# Patient Record
Sex: Female | Born: 1990 | Race: White | Hispanic: No | Marital: Single | Smoking: Never smoker
Health system: Southern US, Community
[De-identification: ages and names within clinical notes are randomized; demographics above are authoritative.]

---
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
## 2008-05-29 ENCOUNTER — Emergency Department (HOSPITAL_COMMUNITY): Admission: EM | Admit: 2008-05-29 | Discharge: 2008-05-30 | Payer: Self-pay | Admitting: Emergency Medicine

## 2009-12-12 IMAGING — US US ABDOMEN COMPLETE
1 series · 14 of 25 positions shown · non-contrast
Comparison: None

CLINICAL DATA: Abdominal pain, vomiting

ABDOMEN ULTRASOUND
TECHNIQUE: Complete abdominal ultrasound examination was performed
including evaluation of the liver, gallbladder, bile ducts,
pancreas, kidneys, spleen, IVC, and abdominal aorta.

[Series 1: unknown · 0.33mm/px · 14 of 46 slices shown]
[im 1/46]
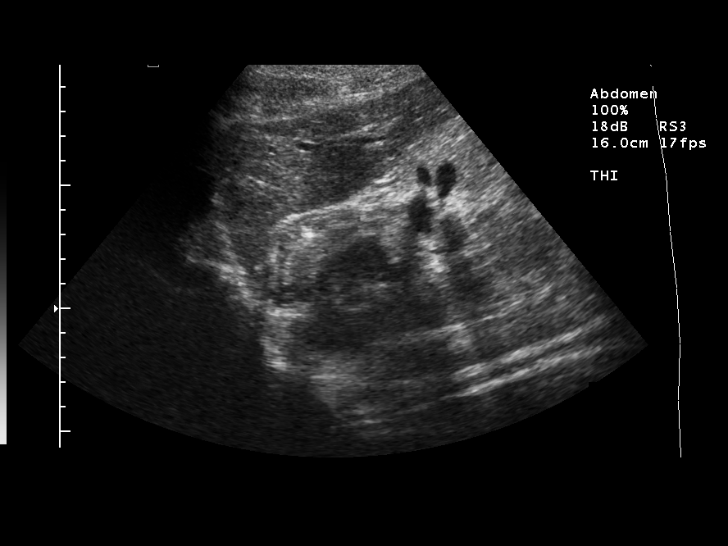
[im 4/46]
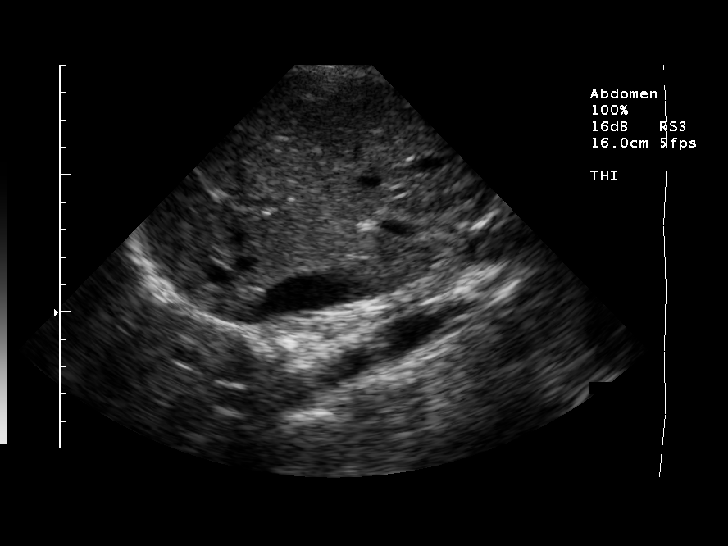
[im 8/46]
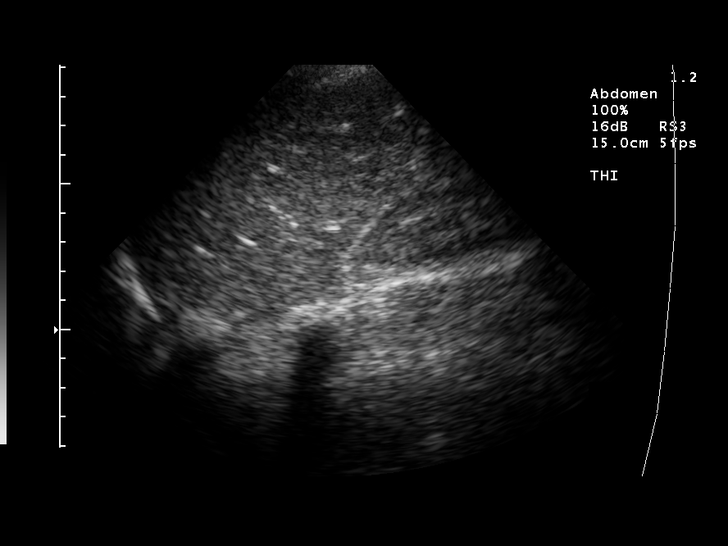
[im 12/46]
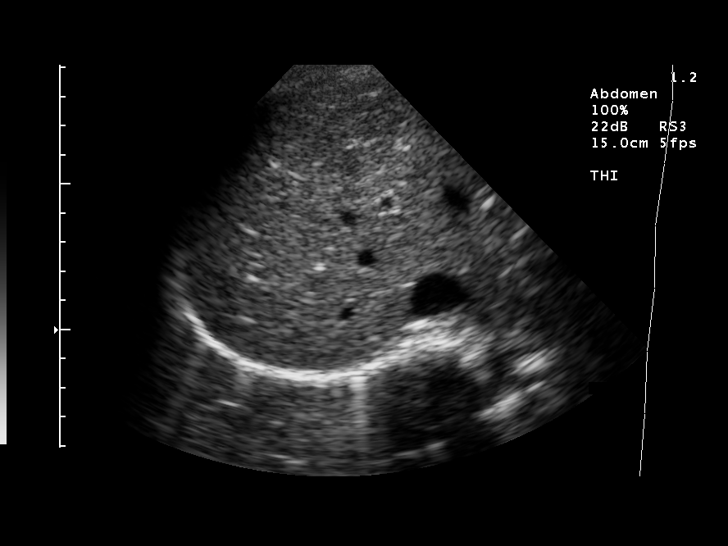
[im 16/46]
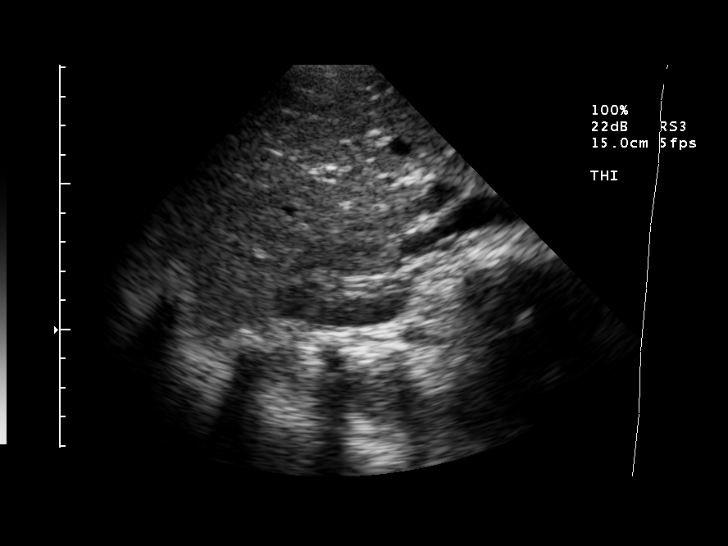
[im 17/46]
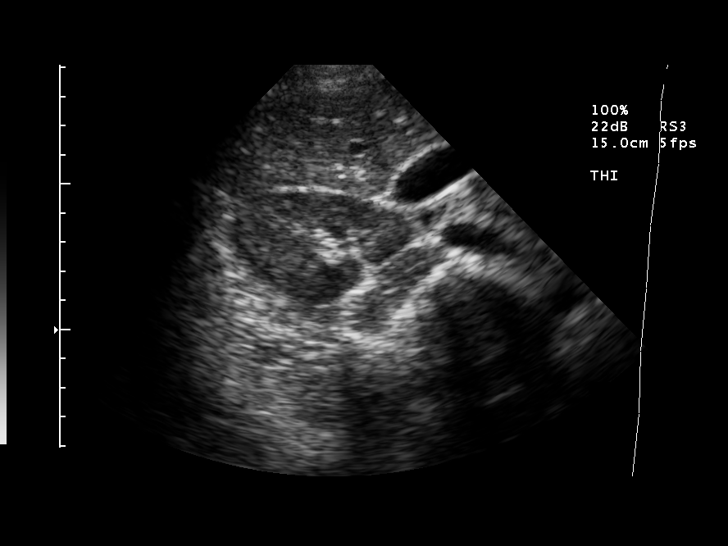
[im 21/46]
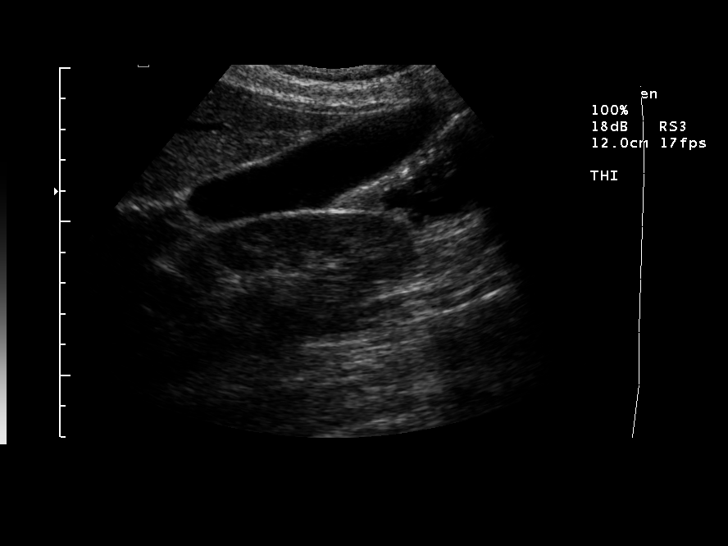
[im 25/46]
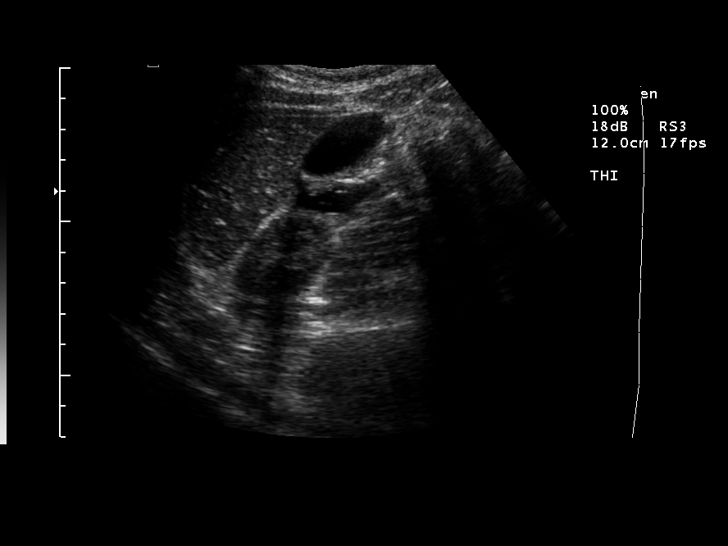
[im 29/46]
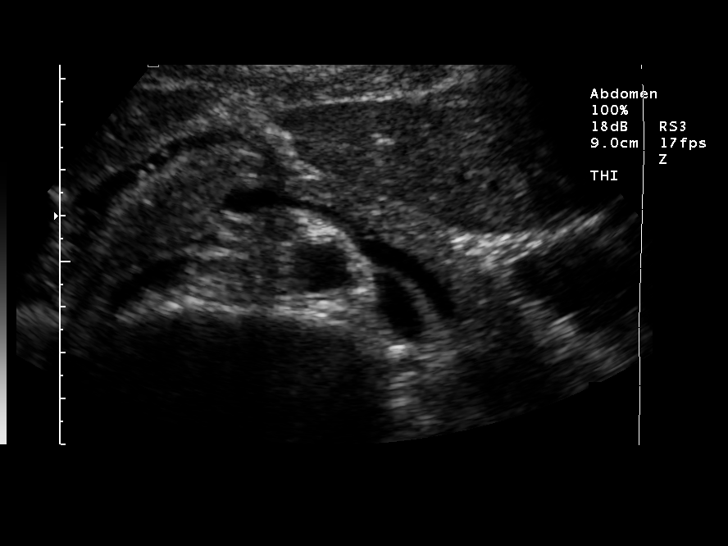
[im 31/46]
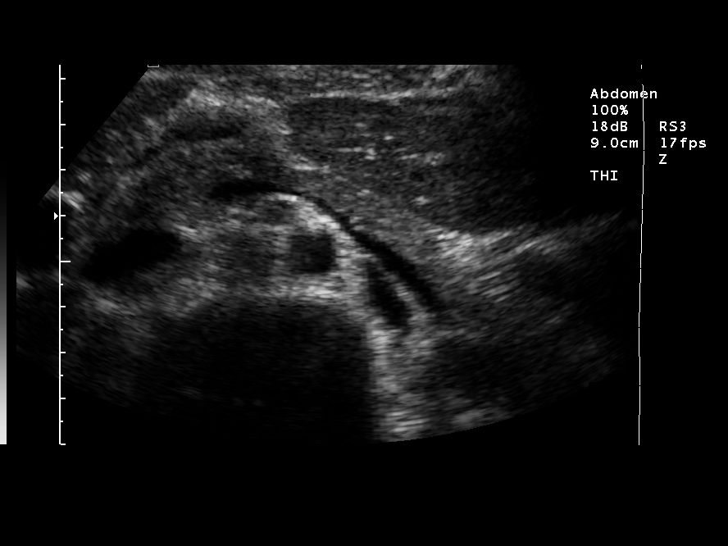
[im 34/46]
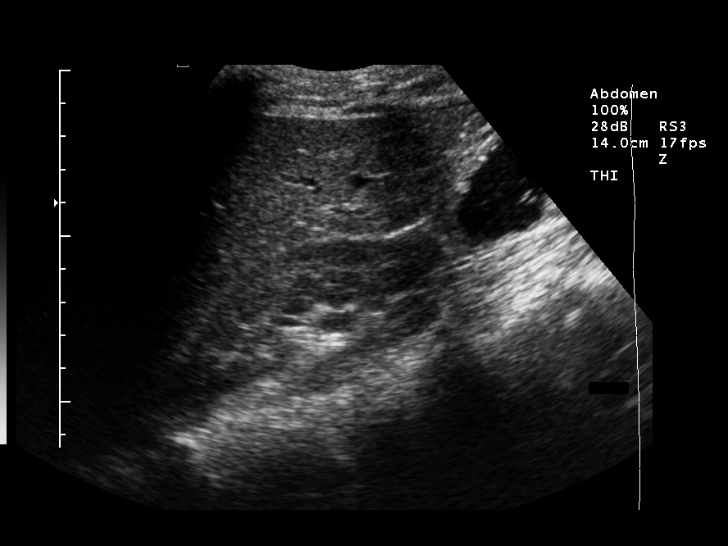
[im 38/46]
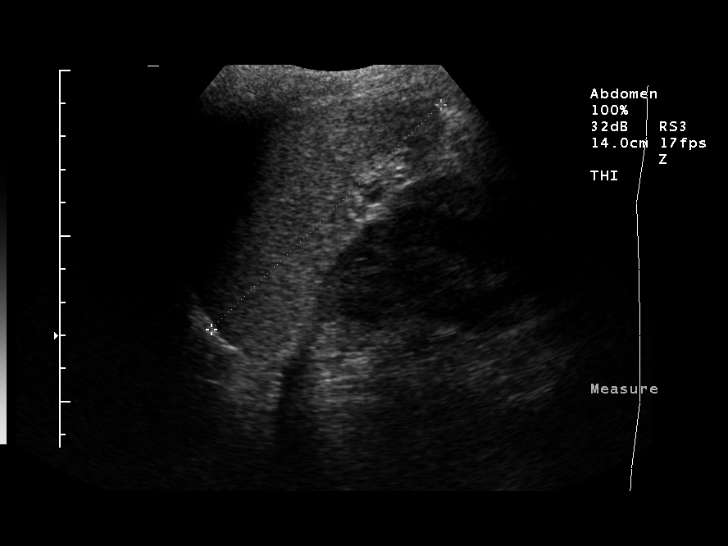
[im 42/46]
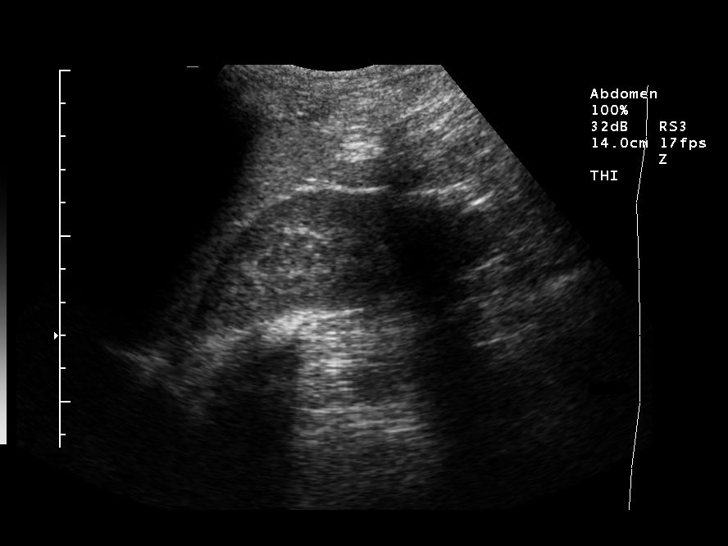
[im 46/46]
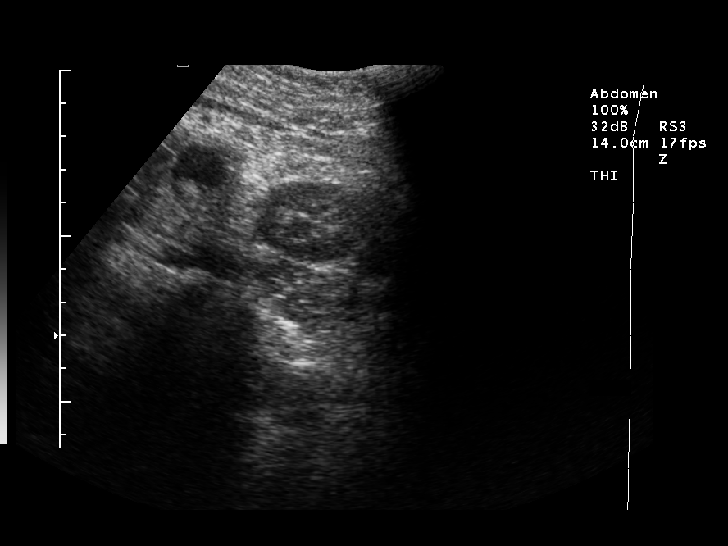

[14 of 25 positions shown; findings below may reference images not displayed]

FINDINGS: Gallbladder:  No gallstones, gallbladder wall thickening,
or pericholecystic fluid.

Common bile duct: Within normal limits in caliber, 2 mm

Liver:  No focal parenchymal abnormalities.  Within normal limits
in parenchymal echogenicity.

Inferior vena cava:  Visualized portion unremarkable.

Pancreas:  Visualized portion unremarkable.

Spleen:  Within normal limits in size and echogenicity.

Right kidney:  Within normal limits in size and echogenicity. No
evidence of mass or hydronephrosis.

Left kidney:  Within normal limits in size and echogenicity. No
evidence of mass or hydronephrosis.

Abdominal aorta:  Within normal limits in caliber.

]
IMPRESSION: No acute findings in the abdomen.

## 2011-09-16 LAB — CBC
HCT: 39.3
Hemoglobin: 14
Platelets: 273
RBC: 4.37
WBC: 11.6 — ABNORMAL HIGH

## 2011-09-16 LAB — POCT PREGNANCY, URINE: Preg Test, Ur: NEGATIVE

## 2011-09-16 LAB — URINE MICROSCOPIC-ADD ON

## 2011-09-16 LAB — COMPREHENSIVE METABOLIC PANEL
Albumin: 4.4
Alkaline Phosphatase: 61
BUN: 11
Chloride: 105
Glucose, Bld: 101 — ABNORMAL HIGH
Potassium: 3.3 — ABNORMAL LOW
Total Bilirubin: 0.9

## 2011-09-16 LAB — URINALYSIS, ROUTINE W REFLEX MICROSCOPIC
Bilirubin Urine: NEGATIVE
Hgb urine dipstick: NEGATIVE
Ketones, ur: 15 — AB
Protein, ur: 100 — AB
Urobilinogen, UA: 1

## 2011-09-16 LAB — DIFFERENTIAL
Basophils Absolute: 0
Basophils Relative: 0
Eosinophils Relative: 0
Monocytes Absolute: 0.4
Neutro Abs: 10.3 — ABNORMAL HIGH

## 2012-07-05 ENCOUNTER — Ambulatory Visit (INDEPENDENT_AMBULATORY_CARE_PROVIDER_SITE_OTHER): Payer: 59 | Admitting: Physician Assistant

## 2012-07-05 VITALS — BP 110/66 | HR 114 | Temp 99.0°F | Resp 16 | Ht 64.0 in | Wt 114.0 lb

## 2012-07-05 DIAGNOSIS — J019 Acute sinusitis, unspecified: Secondary | ICD-10-CM

## 2012-07-05 MED ORDER — AMOXICILLIN 875 MG PO TABS: 1750.0000 mg | ORAL_TABLET | Freq: Two times a day (BID) | ORAL | Status: DC

## 2012-07-05 MED ORDER — GUAIFENESIN ER 1200 MG PO TB12: 1.0000 | ORAL_TABLET | Freq: Two times a day (BID) | ORAL | Status: DC | PRN

## 2012-07-05 NOTE — Progress Notes (Signed)
  Subjective:    Patient ID: Erica Oliver, female    DOB: [birth year], 23 y.o.   MRN: [id number]  HPI This 21 y.o. Female presents for "a sinus infection."  HA, body aches, sinus pressure and congestion. These symptoms began yesterday, and are classic for her typical sinus infections. No fever, chills, GI/GU symptoms.  Review of Systems As above.  History reviewed. No pertinent past medical history.  Prior to Admission medications   Medication Sig Start Date End Date Taking? Authorizing Provider  ipratropium (ATROVENT) 0.06 % nasal spray Place 2 sprays into the nose 2 (two) times daily as needed.   Yes Historical Provider, MD  Norgestim-Eth Estrad Triphasic (TRI-SPRINTEC PO) Take 1 tablet by mouth daily.   Yes Historical Provider, MD   No Known Allergies  History   Social History  . Marital Status: Single   Occupational History  . Server Network engineer)   Social History Main Topics  . Smoking status: Never Smoker   . Smokeless tobacco: Never Used  . Alcohol Use: No  . Drug Use: No  . Sexually Active: Yes -- Female partner(s)    Birth Control/ Protection: Pill     monogamous  History reviewed. No pertinent family history.     Objective:   Physical Exam  Vitals reviewed. Constitutional: She is oriented to person, place, and time. Vital signs are normal. She appears well-developed and well-nourished. No distress.  HENT:  Head: Normocephalic and atraumatic.  Right Ear: Hearing, tympanic membrane, external ear and ear canal normal.  Left Ear: Hearing, tympanic membrane, external ear and ear canal normal.  Nose: Mucosal edema and rhinorrhea present.  No foreign bodies. Right sinus exhibits no maxillary sinus tenderness and no frontal sinus tenderness. Left sinus exhibits no maxillary sinus tenderness and no frontal sinus tenderness.  Mouth/Throat: Uvula is midline, oropharynx is clear and moist and mucous membranes are normal. No uvula swelling. No oropharyngeal  exudate.       Frontal sinus tenderness. No maxillary sinus tenderness.  Eyes: Conjunctivae, EOM and lids are normal. Pupils are equal, round, and reactive to light. Right eye exhibits no discharge. Left eye exhibits no discharge. No scleral icterus.  Neck: Trachea normal, normal range of motion and full passive range of motion without pain. Neck supple. No mass and no thyromegaly present.  Cardiovascular: Normal rate, regular rhythm and normal heart sounds.   Pulmonary/Chest: Effort normal and breath sounds normal.  Lymphadenopathy:       Head (right side): No submandibular, no tonsillar, no preauricular, no posterior auricular and no occipital adenopathy present.       Head (left side): No submandibular, no tonsillar, no preauricular and no occipital adenopathy present.    She has no cervical adenopathy.       Right: No supraclavicular adenopathy present.       Left: No supraclavicular adenopathy present.  Neurological: She is alert and oriented to person, place, and time. She has normal strength. No cranial nerve deficit or sensory deficit.  Skin: Skin is warm, dry and intact. No rash noted.  Psychiatric: She has a normal mood and affect. Her speech is normal and behavior is normal.      Assessment & Plan:   1. Acute sinusitis, unspecified  Guaifenesin (MUCINEX MAXIMUM STRENGTH) 1200 MG TB12, amoxicillin (AMOXIL) 875 MG tablet   Patient Instructions  Get plenty of rest and drink at least 64 ounces of water daily.

## 2012-07-05 NOTE — Patient Instructions (Signed)
Get plenty of rest and drink at least 64 ounces of water daily. 

## 2012-07-10 ENCOUNTER — Telehealth: Payer: Self-pay

## 2012-07-10 DIAGNOSIS — J019 Acute sinusitis, unspecified: Secondary | ICD-10-CM

## 2012-07-10 MED ORDER — AMOXICILLIN 875 MG PO TABS: 1750.0000 mg | ORAL_TABLET | Freq: Two times a day (BID) | ORAL | Status: AC

## 2012-07-10 NOTE — Telephone Encounter (Signed)
Pt would like for her to do a refill on amoxycillin-[facility] [address] [phone]

## 2012-07-10 NOTE — Telephone Encounter (Signed)
Sent to pharmacy 

## 2012-07-11 NOTE — Telephone Encounter (Signed)
LMOM RX sent to pharmacy 

## 2013-02-22 ENCOUNTER — Ambulatory Visit: Payer: 59 | Admitting: Emergency Medicine

## 2013-02-22 VITALS — BP 108/78 | HR 86 | Temp 97.7°F | Resp 12 | Ht 64.5 in | Wt 119.8 lb

## 2013-02-22 DIAGNOSIS — J329 Chronic sinusitis, unspecified: Secondary | ICD-10-CM

## 2013-02-22 DIAGNOSIS — B373 Candidiasis of vulva and vagina: Secondary | ICD-10-CM

## 2013-02-22 MED ORDER — AMOXICILLIN-POT CLAVULANATE 875-125 MG PO TABS: 1.0000 | ORAL_TABLET | Freq: Two times a day (BID) | ORAL | Status: DC

## 2013-02-22 MED ORDER — FLUCONAZOLE 150 MG PO TABS: 150.0000 mg | ORAL_TABLET | Freq: Once | ORAL | Status: DC

## 2013-02-22 NOTE — Patient Instructions (Addendum)

## 2013-02-22 NOTE — Progress Notes (Signed)
  Subjective:    Patient ID: Erica Oliver, female    DOB: March 10, 1991, 22 y.o.   MRN: [id number]  HPI patient enters with head congestion sore throat cough which was started about 3 weeks ago. She seemed to be getting better. She subsequently has developed significant pain in her teeth pain in her facial area associated with yellow nasal discharge. She has a history of sinus infections her last being in July when she was treated with amoxicillin    Review of Systems     Objective:   Physical Exam patient is alert and cooperative. The TMs are clear. Nose exam reveals the turbinates to be swollen. The posterior pharynx is normal. There is tenderness over both maxillary sinuses. There is a tender 1 x 0.5 cm left anterior cervical node. Her chest is clear to auscultation and percussion        Assessment & Plan:  Patient has an acute sinusitis. We'll treat with Augmentin for 10 days and she was given a prescription for Diflucan for yeast infection. She is a 1-1/2 cm left anterior cervical node I had her feel this area and she is to return to clinic in 2 weeks if that node has not resolved.

## 2021-04-07 ENCOUNTER — Encounter (HOSPITAL_COMMUNITY): Payer: Self-pay | Admitting: Emergency Medicine

## 2021-04-07 ENCOUNTER — Other Ambulatory Visit: Payer: Self-pay

## 2021-04-07 ENCOUNTER — Telehealth (HOSPITAL_COMMUNITY): Payer: Self-pay | Admitting: Family Medicine

## 2021-04-07 ENCOUNTER — Ambulatory Visit (HOSPITAL_COMMUNITY)
Admission: EM | Admit: 2021-04-07 | Discharge: 2021-04-07 | Disposition: A | Discharge status: Home / Self Care | Payer: No Typology Code available for payment source | Attending: Emergency Medicine | Admitting: Emergency Medicine

## 2021-04-07 DIAGNOSIS — A084 Viral intestinal infection, unspecified: Secondary | ICD-10-CM | POA: Diagnosis not present

## 2021-04-07 MED ORDER — PROMETHAZINE HCL 25 MG RE SUPP: 25.0000 mg | Freq: Four times a day (QID) | RECTAL | 0 refills | Status: DC | PRN

## 2021-04-07 MED ORDER — ONDANSETRON 4 MG PO TBDP
4.0000 mg | ORAL_TABLET | Freq: Once | ORAL | Status: AC
  Administered 2021-04-07: 4 mg via ORAL

## 2021-04-07 MED ORDER — ONDANSETRON HCL 4 MG PO TABS: 4.0000 mg | ORAL_TABLET | Freq: Four times a day (QID) | ORAL | 0 refills | Status: DC

## 2021-04-07 MED ORDER — ONDANSETRON 4 MG PO TBDP
ORAL_TABLET | ORAL | Status: AC
  Filled 2021-04-07: qty 1

## 2021-04-07 NOTE — ED Triage Notes (Signed)
Pt c/o vomiting onset 2 days associated w/nausea and abd pain d/t vomiting, diarrhea.... also reports light red blood in her stool that has subsided.   Denies fevers  Taking OTC Tums, Pepto-bismol w/no relief  A&O x4... NAD.Marland Kitchen ambulatory

## 2021-04-07 NOTE — Telephone Encounter (Signed)
Reports continued vomiting despite med Rx here earlier today.  Meds ordered this encounter  Medications  . promethazine (PHENERGAN) 25 MG suppository    Sig: Place 1 suppository (25 mg total) rectally every 6 (six) hours as needed for nausea or vomiting.    Dispense:  12 each    Refill:  0    ED if not helping.

## 2021-04-07 NOTE — Discharge Instructions (Signed)
Can you use nausea medication every 6 hours as needed  Once nausea settles try to rehydrate as much as possible using water and electrolyte replacements such as Gatorade, body armour, pedialyte. Once tolerating liquids can try bland foods next

## 2021-04-07 NOTE — ED Provider Notes (Signed)
[facility]    CSN: [id number] Arrival date & time: 04/07/21  0915      History   Chief Complaint Chief Complaint  Patient presents with  . Nausea  . Emesis    HPI Erica Oliver is a 30 y.o. female.   Patient presents with nausea, vomiting, diarrhea and abdominal pain for 2 days. Fever was present on day 1 but has resolved. Has been dry heaving all morning and diarrhea has slowed with 1 occurrence today. Abdominal pain is sharp and cramping. Notice blood in toilet once. Unable to tolerate food or liquids. Feels dehydrated and light headed. Denies changes in diet, recent travel. No known sick contacts.   History reviewed. No pertinent past medical history.  There are no problems to display for this patient.   History reviewed. No pertinent surgical history.  OB History   No obstetric history on file.      Home Medications    Prior to Admission medications   Medication Sig Start Date End Date Taking? Authorizing Provider  ondansetron (ZOFRAN) 4 MG tablet Take 1 tablet (4 mg total) by mouth every 6 (six) hours. 04/07/21  Yes Natarsha Hurwitz, Elita Boone, NP  amoxicillin-clavulanate (AUGMENTIN) 875-125 MG per tablet Take 1 tablet by mouth 2 (two) times daily. 02/22/13   Collene Gobble, MD  fluconazole (DIFLUCAN) 150 MG tablet Take 1 tablet (150 mg total) by mouth once. Repeat if needed 02/22/13   Collene Gobble, MD  Guaifenesin [facility] MAXIMUM STRENGTH) 1200 MG TB12 Take 1 tablet (1,200 mg total) by mouth every 12 (twelve) hours as needed. 07/05/12   Porfirio Oar, PA  ipratropium (ATROVENT) 0.06 % nasal spray Place 2 sprays into the nose 2 (two) times daily as needed.    [provider]  Lorita Officer Triphasic (TRI-SPRINTEC PO) Take 1 tablet by mouth daily.    [provider]    Family History History reviewed. No pertinent family history.  Social History Social History   Tobacco Use  . Smoking status: Never Smoker  . Smokeless tobacco: Never  Used  Substance Use Topics  . Alcohol use: Yes    Comment: occas  . Drug use: No     Allergies   Mushroom extract complex   Review of Systems Review of Systems  Constitutional: Negative.   HENT: Negative.   Respiratory: Negative.   Cardiovascular: Negative.   Gastrointestinal: Positive for abdominal pain, diarrhea, nausea and vomiting. Negative for abdominal distention, anal bleeding, blood in stool, constipation and rectal pain.  Skin: Negative.   Neurological: Positive for light-headedness. Negative for dizziness, tremors, seizures, syncope, facial asymmetry, speech difficulty, weakness, numbness and headaches.     Physical Exam Triage Vital Signs ED Triage Vitals  Enc Vitals Group     BP 04/07/21 0943 (!) 164/91     Pulse Rate 04/07/21 0943 65     Resp 04/07/21 0943 16     Temp 04/07/21 0943 97.7 F (36.5 C)     Temp Source 04/07/21 0943 Oral     SpO2 04/07/21 0943 99 %     Weight --      Height --      Head Circumference --      Peak Flow --      Pain Score 04/07/21 0945 8     Pain Loc --      Pain Edu? --      Excl. in GC? --    No data found.  Updated Vital Signs  BP (!) 164/91 (BP Location: Left Arm)   Pulse 65   Temp 97.7 F (36.5 C) (Oral)   Resp 16   LMP 03/24/2021   SpO2 99%   Visual Acuity Right Eye Distance:   Left Eye Distance:   Bilateral Distance:    Right Eye Near:   Left Eye Near:    Bilateral Near:     Physical Exam Constitutional:      Appearance: Normal appearance. She is normal weight.  HENT:     Head: Normocephalic.  Eyes:     Extraocular Movements: Extraocular movements intact.  Pulmonary:     Effort: Pulmonary effort is normal.  Abdominal:     General: Abdomen is flat. Bowel sounds are increased.     Palpations: Abdomen is soft.     Tenderness: There is abdominal tenderness in the epigastric area.  Musculoskeletal:        General: Normal range of motion.  Skin:    General: Skin is warm and dry.  Neurological:      General: No focal deficit present.     Mental Status: She is alert and oriented to person, place, and time. Mental status is at baseline.      UC Treatments / Results  Labs (all labs ordered are listed, but only abnormal results are displayed) Labs Reviewed - No data to display  EKG   Radiology No results found.  Procedures Procedures (including critical care time)  Medications Ordered in UC Medications  ondansetron (ZOFRAN-ODT) disintegrating tablet 4 mg (4 mg Oral Given 04/07/21 0958)    Initial Impression / Assessment and Plan / UC Course  I have reviewed the triage vital signs and the nursing notes.  Pertinent labs & imaging results that were available during my care of the patient were reviewed by me and considered in my medical decision making (see chart for details).  Viral Gastroenteritis  1. zofran 4 mg ODT now, able to tolerate water after dose 2. zofran 4 mg every 6 hours as needed 3. Advised rehydration once nausea settles  Final Clinical Impressions(s) / UC Diagnoses   Final diagnoses:  Viral gastroenteritis     Discharge Instructions     Can you use nausea medication every 6 hours as needed  Once nausea settles try to rehydrate as much as possible using water and electrolyte replacements such as Gatorade, body armour, pedialyte. Once tolerating liquids can try bland foods next    ED Prescriptions    Medication Sig Dispense Auth. Provider   ondansetron (ZOFRAN) 4 MG tablet Take 1 tablet (4 mg total) by mouth every 6 (six) hours. 20 tablet Valinda Hoar, NP     PDMP not reviewed this encounter.   Valinda Hoar, NP 04/07/21 1048

## 2022-10-26 ENCOUNTER — Emergency Department (HOSPITAL_BASED_OUTPATIENT_CLINIC_OR_DEPARTMENT_OTHER): Payer: No Typology Code available for payment source

## 2022-10-26 ENCOUNTER — Other Ambulatory Visit: Payer: Self-pay

## 2022-10-26 ENCOUNTER — Encounter (HOSPITAL_BASED_OUTPATIENT_CLINIC_OR_DEPARTMENT_OTHER): Payer: Self-pay

## 2022-10-26 ENCOUNTER — Emergency Department (HOSPITAL_BASED_OUTPATIENT_CLINIC_OR_DEPARTMENT_OTHER)
Admission: EM | Admit: 2022-10-26 | Discharge: 2022-10-26 | Disposition: A | Discharge status: Home / Self Care | Payer: No Typology Code available for payment source | Attending: Emergency Medicine | Admitting: Emergency Medicine

## 2022-10-26 DIAGNOSIS — R109 Unspecified abdominal pain: Secondary | ICD-10-CM | POA: Diagnosis present

## 2022-10-26 DIAGNOSIS — Z20822 Contact with and (suspected) exposure to covid-19: Secondary | ICD-10-CM | POA: Insufficient documentation

## 2022-10-26 DIAGNOSIS — E876 Hypokalemia: Secondary | ICD-10-CM | POA: Insufficient documentation

## 2022-10-26 DIAGNOSIS — R1084 Generalized abdominal pain: Secondary | ICD-10-CM | POA: Diagnosis not present

## 2022-10-26 DIAGNOSIS — R112 Nausea with vomiting, unspecified: Secondary | ICD-10-CM | POA: Insufficient documentation

## 2022-10-26 LAB — COMPREHENSIVE METABOLIC PANEL
ALT: 17 U/L (ref 0–44)
AST: 15 U/L (ref 15–41)
Albumin: 5.5 g/dL — ABNORMAL HIGH (ref 3.5–5.0)
Alkaline Phosphatase: 50 U/L (ref 38–126)
Anion gap: 18 — ABNORMAL HIGH (ref 5–15)
BUN: 11 mg/dL (ref 6–20)
CO2: 23 mmol/L (ref 22–32)
Calcium: 10.3 mg/dL (ref 8.9–10.3)
Chloride: 97 mmol/L — ABNORMAL LOW (ref 98–111)
Creatinine, Ser: 0.89 mg/dL (ref 0.44–1.00)
GFR, Estimated: >60 mL/min (ref >60)
Glucose, Bld: 152 mg/dL — ABNORMAL HIGH (ref 70–99)
Potassium: 3 mmol/L — ABNORMAL LOW (ref 3.5–5.1)
Sodium: 138 mmol/L (ref 135–145)
Total Bilirubin: 0.9 mg/dL (ref 0.3–1.2)
Total Protein: 8.4 g/dL — ABNORMAL HIGH (ref 6.5–8.1)

## 2022-10-26 LAB — CBC
HCT: 37.4 % (ref 36.0–46.0)
Hemoglobin: 13.1 g/dL (ref 12.0–15.0)
MCH: 32.5 pg (ref 26.0–34.0)
MCHC: 35 g/dL (ref 30.0–36.0)
MCV: 92.8 fL (ref 80.0–100.0)
Platelets: 269 10*3/uL (ref 150–400)
RBC: 4.03 MIL/uL (ref 3.87–5.11)
RDW: 12.7 % (ref 11.5–15.5)
WBC: 9.5 10*3/uL (ref 4.0–10.5)
nRBC: 0 % (ref 0.0–0.2)

## 2022-10-26 LAB — URINALYSIS, ROUTINE W REFLEX MICROSCOPIC
Bilirubin Urine: NEGATIVE
Glucose, UA: NEGATIVE mg/dL
Hgb urine dipstick: NEGATIVE
Ketones, ur: 15 mg/dL — AB
Leukocytes,Ua: NEGATIVE
Nitrite: NEGATIVE
Protein, ur: 100 mg/dL — AB
Specific Gravity, Urine: 1.032 — ABNORMAL HIGH (ref 1.005–1.030)
pH: 7 (ref 5.0–8.0)

## 2022-10-26 LAB — PREGNANCY, URINE: Preg Test, Ur: NEGATIVE

## 2022-10-26 LAB — RESP PANEL BY RT-PCR (FLU A&B, COVID) ARPGX2
Influenza A by PCR: NEGATIVE
Influenza B by PCR: NEGATIVE
SARS Coronavirus 2 by RT PCR: NEGATIVE

## 2022-10-26 LAB — LIPASE, BLOOD: Lipase: <10 U/L — ABNORMAL LOW (ref 11–51)

## 2022-10-26 MED ORDER — METOCLOPRAMIDE HCL 5 MG/ML IJ SOLN
10.0000 mg | Freq: Once | INTRAMUSCULAR | Status: AC
  Administered 2022-10-26: 10 mg via INTRAVENOUS
  Filled 2022-10-26: qty 2

## 2022-10-26 MED ORDER — MORPHINE SULFATE (PF) 4 MG/ML IV SOLN
4.0000 mg | Freq: Once | INTRAVENOUS | Status: AC
  Administered 2022-10-26: 4 mg via INTRAVENOUS
  Filled 2022-10-26: qty 1

## 2022-10-26 MED ORDER — POTASSIUM CHLORIDE CRYS ER 20 MEQ PO TBCR
40.0000 meq | EXTENDED_RELEASE_TABLET | Freq: Once | ORAL | Status: AC
  Administered 2022-10-26: 40 meq via ORAL
  Filled 2022-10-26: qty 2

## 2022-10-26 MED ORDER — SODIUM CHLORIDE 0.9 % IV BOLUS
1000.0000 mL | Freq: Once | INTRAVENOUS | Status: AC
  Administered 2022-10-26: 1000 mL via INTRAVENOUS

## 2022-10-26 MED ORDER — METOCLOPRAMIDE HCL 10 MG PO TABS: 10.0000 mg | ORAL_TABLET | Freq: Four times a day (QID) | ORAL | 0 refills | Status: DC

## 2022-10-26 MED ORDER — IBUPROFEN 800 MG PO TABS: 800.0000 mg | ORAL_TABLET | Freq: Three times a day (TID) | ORAL | 0 refills | Status: AC

## 2022-10-26 MED ORDER — ACETAMINOPHEN 500 MG PO TABS: 500.0000 mg | ORAL_TABLET | Freq: Four times a day (QID) | ORAL | 0 refills | Status: AC | PRN

## 2022-10-26 MED ORDER — IOHEXOL 300 MG/ML  SOLN
100.0000 mL | Freq: Once | INTRAMUSCULAR | Status: AC | PRN
  Administered 2022-10-26: 75 mL via INTRAVENOUS

## 2022-10-26 NOTE — ED Triage Notes (Signed)
Pt c/o abdominal, nausea, and vomiting. Pt sent to ED from an UC.

## 2022-10-26 NOTE — ED Notes (Signed)
Pt verbalizes understanding of discharge instructions. Opportunity for questioning and answers were provided. Pt discharged from ED to home with family.    

## 2022-10-26 NOTE — Discharge Instructions (Addendum)
You came to the emergency department today with abdominal pain, nausea and vomiting.  Your CT scan was normal.  Your ultrasound also showed no abnormalities. Your potassium was low, likely because you have been throwing up.    I would like you to follow-up with your PCP however ultimately they may refer you to gastroenterology if your symptoms persist.  I am attaching a gastroenterology office for you to call for an appointment if you need it for continued symptoms.

## 2022-10-26 NOTE — ED Provider Notes (Signed)
[facility] Provider Note   CSN: [id number] Arrival date & time: 10/26/22  1326     History  Chief Complaint  Patient presents with   Abdominal Pain   Nausea   Emesis    Erica Oliver is a 31 y.o. female presenting with abdominal pain, nausea and vomiting since last night.  Started acutely after eating mac & cheese.  Went to urgent care today and they sent her to the emergency department for imaging.  No history of gallbladder disease.  No history of abdominal surgery.  Menstrual period started today.  No vaginal or urinary symptoms.  Given Zofran and Phenergan at urgent care without any   Abdominal Pain Associated symptoms: nausea and vomiting   Associated symptoms: no chills, no constipation, no diarrhea, no dysuria, no fever, no hematuria and no vaginal discharge   Emesis Associated symptoms: abdominal pain   Associated symptoms: no chills, no diarrhea and no fever        Home Medications Prior to Admission medications   Medication Sig Start Date End Date Taking? Authorizing Provider  amoxicillin-clavulanate (AUGMENTIN) 875-125 MG per tablet Take 1 tablet by mouth 2 (two) times daily. 02/22/13   Darlyne Russian, MD  fluconazole (DIFLUCAN) 150 MG tablet Take 1 tablet (150 mg total) by mouth once. Repeat if needed 02/22/13   Darlyne Russian, MD  Guaifenesin [facility] MAXIMUM STRENGTH) 1200 MG TB12 Take 1 tablet (1,200 mg total) by mouth every 12 (twelve) hours as needed. 07/05/12   Harrison Mons, PA  ipratropium (ATROVENT) 0.06 % nasal spray Place 2 sprays into the nose 2 (two) times daily as needed.    [provider]  Lenard Forth Triphasic (TRI-SPRINTEC PO) Take 1 tablet by mouth daily.    [provider]  ondansetron (ZOFRAN) 4 MG tablet Take 1 tablet (4 mg total) by mouth every 6 (six) hours. 04/07/21   White, Leitha Schuller, NP  promethazine (PHENERGAN) 25 MG suppository Place 1 suppository (25 mg total) rectally every 6 (six)  hours as needed for nausea or vomiting. 04/07/21   Vanessa Kick, MD      Allergies    Mushroom extract complex    Review of Systems   Review of Systems  Constitutional:  Negative for chills and fever.  Gastrointestinal:  Positive for abdominal pain, nausea and vomiting. Negative for constipation and diarrhea.  Genitourinary:  Negative for dysuria, hematuria and vaginal discharge.    Physical Exam Updated Vital Signs BP (!) 139/96   Pulse 60   Temp 99.4 F (37.4 C) (Axillary)   Resp 18   Ht _0  (1.6 m)   Wt 50.8 kg   LMP 10/26/2022   SpO2 100%   BMI 19.84 kg/m  Physical Exam Vitals and nursing note reviewed.  Constitutional:      Appearance: Normal appearance.  HENT:     Head: Normocephalic and atraumatic.  Eyes:     General: No scleral icterus.    Conjunctiva/sclera: Conjunctivae normal.  Pulmonary:     Effort: Pulmonary effort is normal. No respiratory distress.  Abdominal:     General: Abdomen is flat.     Palpations: Abdomen is soft.     Tenderness: There is generalized abdominal tenderness and tenderness in the right upper quadrant, right lower quadrant and suprapubic area.  Skin:    Findings: No rash.  Neurological:     Mental Status: She is alert.  Psychiatric:        Mood and Affect:  Mood normal.     ED Results / Procedures / Treatments   Labs (all labs ordered are listed, but only abnormal results are displayed) Labs Reviewed  LIPASE, BLOOD - Abnormal; Notable for the following components:      Result Value   Lipase <10 (*)    All other components within normal limits  COMPREHENSIVE METABOLIC PANEL - Abnormal; Notable for the following components:   Potassium 3.0 (*)    Chloride 97 (*)    Glucose, Bld 152 (*)    Total Protein 8.4 (*)    Albumin 5.5 (*)    Anion gap 18 (*)    All other components within normal limits  RESP PANEL BY RT-PCR (FLU A&B, COVID) ARPGX2  CBC  URINALYSIS, ROUTINE W REFLEX MICROSCOPIC  PREGNANCY, URINE     EKG None  Radiology No results found.  Procedures Procedures   Medications Ordered in ED Medications  metoCLOPramide (REGLAN) injection 10 mg (10 mg Intravenous Given 10/26/22 1943)  morphine (PF) 4 MG/ML injection 4 mg (4 mg Intravenous Given 10/26/22 1944)  sodium chloride 0.9 % bolus 1,000 mL (1,000 mLs Intravenous New Bag/Given 10/26/22 1942)    ED Course/ Medical Decision Making/ A&P                           Medical Decision Making Amount and/or Complexity of Data Reviewed Labs: ordered. Radiology: ordered.  Risk Prescription drug management.   31 year old female presenting with abdominal pain.  Started acutely after eating mac & cheese last night.  Differential includes but is not limited to cholecystitis, symptomatic cholelithiasis, pancreatitis, appendicitis, gastritis, gastroenteritis.    This is not an exhaustive differential.    Past Medical History / Co-morbidities / Social History: None   Additional history: Reviewed outside facility urgent care note that showed a concern for cholecystitis and patient being sent here.  I was able to confirm that she failed Zofran and Phenergan   Physical Exam: Pertinent physical exam findings include Generalized abdominal tenderness  Lab Tests: I ordered, and personally interpreted labs.  The pertinent results include: Potassium 3.0   Imaging Studies: I ordered and independently visualized and interpreted CTAP and I agree with the radiologist that ***    Medications: I ordered medication including morphine and Reglan. Reevaluation of the patient after these medicines showed that the patient {resolved/improved/worsened:23923::"improved"}. I have reviewed the patients home medicines and have made adjustments as needed.   Consultations Obtained: I spoke with *** with *** and they ***.  MDM/Disposition: This is a ***  After patient's work-up today, I feel that *** .     I discussed this case with my attending  physician Dr. Marland Kitchen who cosigned this note including patient's presenting symptoms, physical exam, and planned diagnostics and interventions. Attending physician stated agreement with plan or made changes to plan which were implemented.     {Document critical care time when appropriate:1} {Document review of labs and clinical decision tools ie heart score, Chads2Vasc2 etc:1}  {Document your independent review of radiology images, and any outside records:1} {Document your discussion with family members, caretakers, and with consultants:1} {Document social determinants of health affecting pt's care:1} {Document your decision making why or why not admission, treatments were needed:1} Final Clinical Impression(s) / ED Diagnoses Final diagnoses:  None    Rx / DC Orders ED Discharge Orders     None

## 2022-10-28 ENCOUNTER — Encounter (HOSPITAL_BASED_OUTPATIENT_CLINIC_OR_DEPARTMENT_OTHER): Payer: Self-pay | Admitting: Emergency Medicine

## 2022-10-28 ENCOUNTER — Emergency Department (HOSPITAL_BASED_OUTPATIENT_CLINIC_OR_DEPARTMENT_OTHER)
Admission: EM | Admit: 2022-10-28 | Discharge: 2022-10-29 | Disposition: A | Discharge status: Home / Self Care | Payer: No Typology Code available for payment source | Attending: Emergency Medicine | Admitting: Emergency Medicine

## 2022-10-28 ENCOUNTER — Other Ambulatory Visit: Payer: Self-pay

## 2022-10-28 DIAGNOSIS — R1084 Generalized abdominal pain: Secondary | ICD-10-CM | POA: Diagnosis not present

## 2022-10-28 DIAGNOSIS — E876 Hypokalemia: Secondary | ICD-10-CM | POA: Insufficient documentation

## 2022-10-28 DIAGNOSIS — R112 Nausea with vomiting, unspecified: Secondary | ICD-10-CM | POA: Diagnosis not present

## 2022-10-28 LAB — MAGNESIUM: Magnesium: 1.7 mg/dL (ref 1.7–2.4)

## 2022-10-28 LAB — COMPREHENSIVE METABOLIC PANEL
ALT: 15 U/L (ref 0–44)
AST: 21 U/L (ref 15–41)
Albumin: 4.6 g/dL (ref 3.5–5.0)
Alkaline Phosphatase: 36 U/L — ABNORMAL LOW (ref 38–126)
Anion gap: 14 (ref 5–15)
BUN: 8 mg/dL (ref 6–20)
CO2: 25 mmol/L (ref 22–32)
Calcium: 9.3 mg/dL (ref 8.9–10.3)
Chloride: 97 mmol/L — ABNORMAL LOW (ref 98–111)
Creatinine, Ser: 0.72 mg/dL (ref 0.44–1.00)
GFR, Estimated: >60 mL/min (ref >60)
Glucose, Bld: 109 mg/dL — ABNORMAL HIGH (ref 70–99)
Potassium: 2.6 mmol/L — CL (ref 3.5–5.1)
Sodium: 136 mmol/L (ref 135–145)
Total Bilirubin: 0.7 mg/dL (ref 0.3–1.2)
Total Protein: 7.4 g/dL (ref 6.5–8.1)

## 2022-10-28 LAB — CBC
HCT: 36.5 % (ref 36.0–46.0)
Hemoglobin: 13 g/dL (ref 12.0–15.0)
MCH: 32.7 pg (ref 26.0–34.0)
MCHC: 35.6 g/dL (ref 30.0–36.0)
MCV: 91.7 fL (ref 80.0–100.0)
Platelets: 266 10*3/uL (ref 150–400)
RBC: 3.98 MIL/uL (ref 3.87–5.11)
RDW: 12 % (ref 11.5–15.5)
WBC: 7.1 10*3/uL (ref 4.0–10.5)
nRBC: 0 % (ref 0.0–0.2)

## 2022-10-28 LAB — LIPASE, BLOOD: Lipase: 19 U/L (ref 11–51)

## 2022-10-28 MED ORDER — POTASSIUM CHLORIDE 10 MEQ/100ML IV SOLN
10.0000 meq | Freq: Once | INTRAVENOUS | Status: AC
  Administered 2022-10-28: 10 meq via INTRAVENOUS
  Filled 2022-10-28: qty 100

## 2022-10-28 MED ORDER — POTASSIUM CHLORIDE CRYS ER 20 MEQ PO TBCR
40.0000 meq | EXTENDED_RELEASE_TABLET | Freq: Once | ORAL | Status: AC
  Administered 2022-10-28: 40 meq via ORAL
  Filled 2022-10-28: qty 2

## 2022-10-28 MED ORDER — POTASSIUM CHLORIDE 10 MEQ/100ML IV SOLN
10.0000 meq | INTRAVENOUS | Status: AC
  Administered 2022-10-29 (×3): 10 meq via INTRAVENOUS
  Filled 2022-10-28 (×3): qty 100

## 2022-10-28 MED ORDER — ALUM & MAG HYDROXIDE-SIMETH 200-200-20 MG/5ML PO SUSP
15.0000 mL | Freq: Once | ORAL | Status: AC
  Administered 2022-10-29: 15 mL via ORAL
  Filled 2022-10-28: qty 30

## 2022-10-28 MED ORDER — MAGNESIUM SULFATE 2 GM/50ML IV SOLN
2.0000 g | Freq: Once | INTRAVENOUS | Status: AC
  Administered 2022-10-28: 2 g via INTRAVENOUS
  Filled 2022-10-28: qty 50

## 2022-10-28 NOTE — ED Triage Notes (Signed)
Nausea and vomiting since Monday. Was seen in ed on Tuesday for same. Was seen by pcp today, labs done  labs called as critical k=2.6

## 2022-10-28 NOTE — ED Provider Notes (Signed)
[facility] EMERGENCY DEPT Provider Note   CSN: [id number] Arrival date & time: 10/28/22  2141     History  Chief Complaint  Patient presents with   Emesis    Erica FOCHT is a 31 y.o. female.  31 yo F here with persistent emesis and hypokalemia. Was seen here a couple days ago and had full workup done to include labs, ct and [place]. Apparently had been feeling better for about 12 hours but then started having emesis and diffuse abdominal discomfort again. Saw her PCP today for reevaluation and found to have low K and sent here for eval. She states nothing is much different than 2 days ago just not feeling better. No alcohol, drugs, tobacco or possibility of pregnancy. Has appt with GI in a couple weeks but has not seen one previously. No h/o gastritis but has had symptoms like this a few times in the past that noone was able to identify the cause.    Emesis      Home Medications Prior to Admission medications   Medication Sig Start Date End Date Taking? Authorizing Provider  acetaminophen (TYLENOL) 500 MG tablet Take 1 tablet (500 mg total) by mouth every 6 (six) hours as needed. 10/26/22   Redwine, Madison A, PA-C  ibuprofen (ADVIL) 800 MG tablet Take 1 tablet (800 mg total) by mouth 3 (three) times daily. 10/26/22   Redwine, Madison A, PA-C  ipratropium (ATROVENT) 0.06 % nasal spray Place 2 sprays into the nose 2 (two) times daily as needed.    [provider]  Lorita Officer Triphasic (TRI-SPRINTEC PO) Take 1 tablet by mouth daily.    [provider]  omeprazole (PRILOSEC) 20 MG capsule Take 1 capsule (20 mg total) by mouth 2 (two) times daily before a meal for 10 days. 10/29/22 11/08/22  Shariyah Eland, Barbara Cower, MD  promethazine (PHENERGAN) 25 MG suppository Place 1 suppository (25 mg total) rectally every 6 (six) hours as needed for nausea or vomiting. 10/29/22   Tayshon Winker, Barbara Cower, MD  promethazine (PHENERGAN) 25 MG tablet Take 1 tablet (25 mg total) by  mouth every 6 (six) hours as needed for nausea or vomiting. 10/29/22   Sagrario Lineberry, Barbara Cower, MD  sucralfate (CARAFATE) 1 g tablet Take 1 tablet (1 g total) by mouth 4 (four) times daily -  with meals and at bedtime for 10 days. 10/29/22 11/08/22  Arnika Larzelere, Barbara Cower, MD      Allergies    Mushroom extract complex    Review of Systems   Review of Systems  Gastrointestinal:  Positive for vomiting.    Physical Exam Updated Vital Signs BP (!) 158/92   Pulse (!) 54   Temp 98.8 F (37.1 C) (Oral)   Resp 16   Ht 5\' 3"  (1.6 m)   Wt 50.3 kg   LMP 10/26/2022 Comment: neg preg test on 10/26/2022  SpO2 99%   BMI 19.66 kg/m  Physical Exam Vitals and nursing note reviewed.  Constitutional:      Appearance: She is well-developed.  HENT:     Head: Normocephalic and atraumatic.     Mouth/Throat:     Mouth: Mucous membranes are dry.  Eyes:     Pupils: Pupils are equal, round, and reactive to light.  Cardiovascular:     Rate and Rhythm: Normal rate and regular rhythm.  Pulmonary:     Effort: No respiratory distress.     Breath sounds: No stridor.  Abdominal:     General: There is no distension.  Tenderness: There is abdominal tenderness (diffuse, non peritonitic).  Musculoskeletal:     Cervical back: Normal range of motion.  Skin:    General: Skin is warm and dry.  Neurological:     General: No focal deficit present.     Mental Status: She is alert.     ED Results / Procedures / Treatments   Labs (all labs ordered are listed, but only abnormal results are displayed) Labs Reviewed  COMPREHENSIVE METABOLIC PANEL - Abnormal; Notable for the following components:      Result Value   Potassium 2.6 (*)    Chloride 97 (*)    Glucose, Bld 109 (*)    Alkaline Phosphatase 36 (*)    All other components within normal limits  POTASSIUM - Abnormal; Notable for the following components:   Potassium 3.3 (*)    All other components within normal limits  LIPASE, BLOOD  CBC  MAGNESIUM     EKG EKG Interpretation  Date/Time:  Thursday October 28 2022 22:07:58 EST Ventricular Rate:  65 PR Interval:  120 QRS Duration: 76 QT Interval:  580 QTC Calculation: 603 R Axis:   87 Text Interpretation: Critical Test Result: Long QTc Normal sinus rhythm Prolonged QT Abnormal ECG No previous ECGs available Confirmed by Marily Memos [phone]) on 10/28/2022 10:55:21 PM   EKG Interpretation  Date/Time:  Friday October 29 2022 01:23:41 EST Ventricular Rate:  59 PR Interval:  110 QRS Duration: 81 QT Interval:  480 QTC Calculation: 476 R Axis:   86 Text Interpretation: Ectopic atrial rhythm Borderline short PR interval Nonspecific T abnrm, anterolateral leads ST elev, probable normal early repol pattern Borderline prolonged QT interval significantly improved QT from previous Confirmed by Marily Memos [phone]) on 10/29/2022 11:16:18 PM         Radiology No results found.  Procedures .Critical Care  Performed by: Marily Memos, MD Authorized by: Marily Memos, MD   Critical care provider statement:    Critical care time (minutes):  30   Critical care was necessary to treat or prevent imminent or life-threatening deterioration of the following conditions:  Metabolic crisis   Critical care was time spent personally by me on the following activities:  Development of treatment plan with patient or surrogate, discussions with consultants, evaluation of patient's response to treatment, examination of patient, ordering and review of laboratory studies, ordering and review of radiographic studies, ordering and performing treatments and interventions, pulse oximetry, re-evaluation of patient's condition and review of old charts     Medications Ordered in ED Medications  magnesium sulfate IVPB 2 g 50 mL (0 g Intravenous Stopped 10/29/22 0000)  potassium chloride SA (KLOR-CON M) CR tablet 40 mEq (40 mEq Oral Given 10/28/22 2249)  potassium chloride 10 mEq in 100 mL IVPB (0 mEq  Intravenous Stopped 10/28/22 2355)  alum & mag hydroxide-simeth (MAALOX/MYLANTA) 200-200-20 MG/5ML suspension 15 mL (15 mLs Oral Given 10/29/22 0007)  potassium chloride 10 mEq in 100 mL IVPB (0 mEq Intravenous Stopped 10/29/22 0348)  prochlorperazine (COMPAZINE) injection 10 mg (10 mg Intravenous Given 10/29/22 0214)  pantoprazole (PROTONIX) injection 40 mg (40 mg Intravenous Given 10/29/22 0218)    ED Course/ Medical Decision Making/ A&P                           Medical Decision Making Amount and/or Complexity of Data Reviewed Labs: ordered. ECG/medicine tests: ordered.  Risk OTC drugs. Prescription drug management.   Significantly lower K. QTc  quite prolonged without an old one to compare, no syncope. Home meds not helping (not even able to keep them down). Will start repleting K and Mg. Protonix has relative contraindication in this however I do think gastritis could be playing a role at this point, will try carafate until QT improves. If emesis will need ativan as it is the only anti-emetic without QT effects. Will decide on admission vs discharge pending symptomatic treatment and recheck of labs/ecg.  After couple rounds of potassium and magnesium her QTc improved to below 500.  At this time Protonix and Compazine were given which totally resolved her symptoms.  Recheck potassium after 3 rounds and was back up to 3.3.  She was observed in the ER for about 4 hours after medications with no further QTc prolongation, emesis, vomiting or nausea.  She is tolerating fluids and crackers.  I suspect she has not component of gastritis related to her recent likely viral illness however with the frequency this is happened over the last couple years she still needs to see GI for an endoscopy and maybe even a swallow follow-through study of some sort.  Shared decision making with the family and the patient and they are okay managing at this home now but will return if she starts vomiting again I would  suspect she need to be admitted for GI consult at that time as she is shown that she is prone to get hypokalemia and cardiac effects from it.  Final Clinical Impression(s) / ED Diagnoses Final diagnoses:  Nausea and vomiting, unspecified vomiting type  Hypokalemia    Rx / DC Orders ED Discharge Orders          Ordered    promethazine (PHENERGAN) 25 MG tablet  Every 6 hours PRN,   Status:  Discontinued        10/29/22 0508    promethazine (PHENERGAN) 25 MG suppository  Every 6 hours PRN,   Status:  Discontinued        10/29/22 0508    sucralfate (CARAFATE) 1 g tablet  3 times daily with meals & bedtime,   Status:  Discontinued        10/29/22 0508    omeprazole (PRILOSEC) 20 MG capsule  2 times daily before meals,   Status:  Discontinued        10/29/22 0508    sucralfate (CARAFATE) 1 g tablet  3 times daily with meals & bedtime        10/29/22 0520    promethazine (PHENERGAN) 25 MG tablet  Every 6 hours PRN        10/29/22 0520    promethazine (PHENERGAN) 25 MG suppository  Every 6 hours PRN        10/29/22 0520    omeprazole (PRILOSEC) 20 MG capsule  2 times daily before meals        10/29/22 0520              Jaqualin Serpa, Barbara Cower, MD 10/29/22 2316

## 2022-10-29 LAB — POTASSIUM: Potassium: 3.3 mmol/L — ABNORMAL LOW (ref 3.5–5.1)

## 2022-10-29 MED ORDER — PROCHLORPERAZINE EDISYLATE 10 MG/2ML IJ SOLN
10.0000 mg | Freq: Once | INTRAMUSCULAR | Status: AC
  Administered 2022-10-29: 10 mg via INTRAVENOUS
  Filled 2022-10-29: qty 2

## 2022-10-29 MED ORDER — OMEPRAZOLE 20 MG PO CPDR: 20.0000 mg | DELAYED_RELEASE_CAPSULE | Freq: Two times a day (BID) | ORAL | 0 refills | Status: DC

## 2022-10-29 MED ORDER — PROMETHAZINE HCL 25 MG RE SUPP: 25.0000 mg | Freq: Four times a day (QID) | RECTAL | 0 refills | Status: AC | PRN

## 2022-10-29 MED ORDER — OMEPRAZOLE 20 MG PO CPDR: 20.0000 mg | DELAYED_RELEASE_CAPSULE | Freq: Two times a day (BID) | ORAL | 0 refills | Status: AC

## 2022-10-29 MED ORDER — PANTOPRAZOLE SODIUM 40 MG IV SOLR
40.0000 mg | Freq: Once | INTRAVENOUS | Status: AC
  Administered 2022-10-29: 40 mg via INTRAVENOUS
  Filled 2022-10-29: qty 10

## 2022-10-29 MED ORDER — PROMETHAZINE HCL 25 MG PO TABS: 25.0000 mg | ORAL_TABLET | Freq: Four times a day (QID) | ORAL | 0 refills | Status: AC | PRN

## 2022-10-29 MED ORDER — PROMETHAZINE HCL 25 MG PO TABS: 25.0000 mg | ORAL_TABLET | Freq: Four times a day (QID) | ORAL | 0 refills | Status: DC | PRN

## 2022-10-29 MED ORDER — PROMETHAZINE HCL 25 MG RE SUPP: 25.0000 mg | Freq: Four times a day (QID) | RECTAL | 0 refills | Status: DC | PRN

## 2022-10-29 MED ORDER — SUCRALFATE 1 G PO TABS: 1.0000 g | ORAL_TABLET | Freq: Three times a day (TID) | ORAL | 0 refills | Status: DC

## 2022-10-29 MED ORDER — SUCRALFATE 1 G PO TABS: 1.0000 g | ORAL_TABLET | Freq: Three times a day (TID) | ORAL | 0 refills | Status: AC
